# Patient Record
Sex: Male | Born: 1935 | Race: White | Hispanic: No | Marital: Married | Smoking: Former smoker
Health system: Southern US, Community
[De-identification: ages and names within clinical notes are randomized; demographics above are authoritative.]

## PROBLEM LIST (undated history)

## (undated) DIAGNOSIS — M545 Low back pain, unspecified: Secondary | ICD-10-CM

## (undated) DIAGNOSIS — E785 Hyperlipidemia, unspecified: Secondary | ICD-10-CM

## (undated) DIAGNOSIS — M109 Gout, unspecified: Secondary | ICD-10-CM

## (undated) DIAGNOSIS — H905 Unspecified sensorineural hearing loss: Secondary | ICD-10-CM

## (undated) DIAGNOSIS — Z86711 Personal history of pulmonary embolism: Secondary | ICD-10-CM

## (undated) DIAGNOSIS — N200 Calculus of kidney: Secondary | ICD-10-CM

## (undated) DIAGNOSIS — N400 Enlarged prostate without lower urinary tract symptoms: Secondary | ICD-10-CM

## (undated) DIAGNOSIS — Z87898 Personal history of other specified conditions: Secondary | ICD-10-CM

## (undated) DIAGNOSIS — R768 Other specified abnormal immunological findings in serum: Secondary | ICD-10-CM

## (undated) DIAGNOSIS — Z8619 Personal history of other infectious and parasitic diseases: Secondary | ICD-10-CM

## (undated) HISTORY — DX: Personal history of pulmonary embolism: Z86.711

## (undated) HISTORY — DX: Other specified abnormal immunological findings in serum: R76.8

## (undated) HISTORY — DX: Calculus of kidney: N20.0

## (undated) HISTORY — DX: Gout, unspecified: M10.9

## (undated) HISTORY — DX: Low back pain: M54.5

## (undated) HISTORY — DX: Benign prostatic hyperplasia without lower urinary tract symptoms: N40.0

## (undated) HISTORY — DX: Personal history of other specified conditions: Z87.898

## (undated) HISTORY — DX: Unspecified sensorineural hearing loss: H90.5

## (undated) HISTORY — DX: Hyperlipidemia, unspecified: E78.5

## (undated) HISTORY — DX: Low back pain, unspecified: M54.50

## (undated) HISTORY — DX: Personal history of other infectious and parasitic diseases: Z86.19

---

## 1996-03-23 DIAGNOSIS — Z8619 Personal history of other infectious and parasitic diseases: Secondary | ICD-10-CM

## 1996-03-23 HISTORY — DX: Personal history of other infectious and parasitic diseases: Z86.19

## 1998-03-23 HISTORY — PX: OTHER SURGICAL HISTORY: SHX169

## 1998-05-31 ENCOUNTER — Ambulatory Visit (HOSPITAL_BASED_OUTPATIENT_CLINIC_OR_DEPARTMENT_OTHER): Admission: RE | Admit: 1998-05-31 | Discharge: 1998-05-31 | Payer: Self-pay | Admitting: Surgery

## 2005-12-31 ENCOUNTER — Ambulatory Visit (HOSPITAL_COMMUNITY): Admission: RE | Admit: 2005-12-31 | Discharge: 2005-12-31 | Payer: Self-pay | Admitting: Urology

## 2006-05-27 ENCOUNTER — Ambulatory Visit (HOSPITAL_COMMUNITY): Admission: RE | Admit: 2006-05-27 | Discharge: 2006-05-28 | Payer: Self-pay | Admitting: Neurological Surgery

## 2007-10-27 IMAGING — CR DG ABDOMEN 1V
1 series · 1 of 1 positions shown · non-contrast
Comparison: None.

CLINICAL DATA: 79-year-old, with left ureteral stone and preoperative evaluation. 
ABDOMEN ? 1 VIEW:

[t abdomen supine]
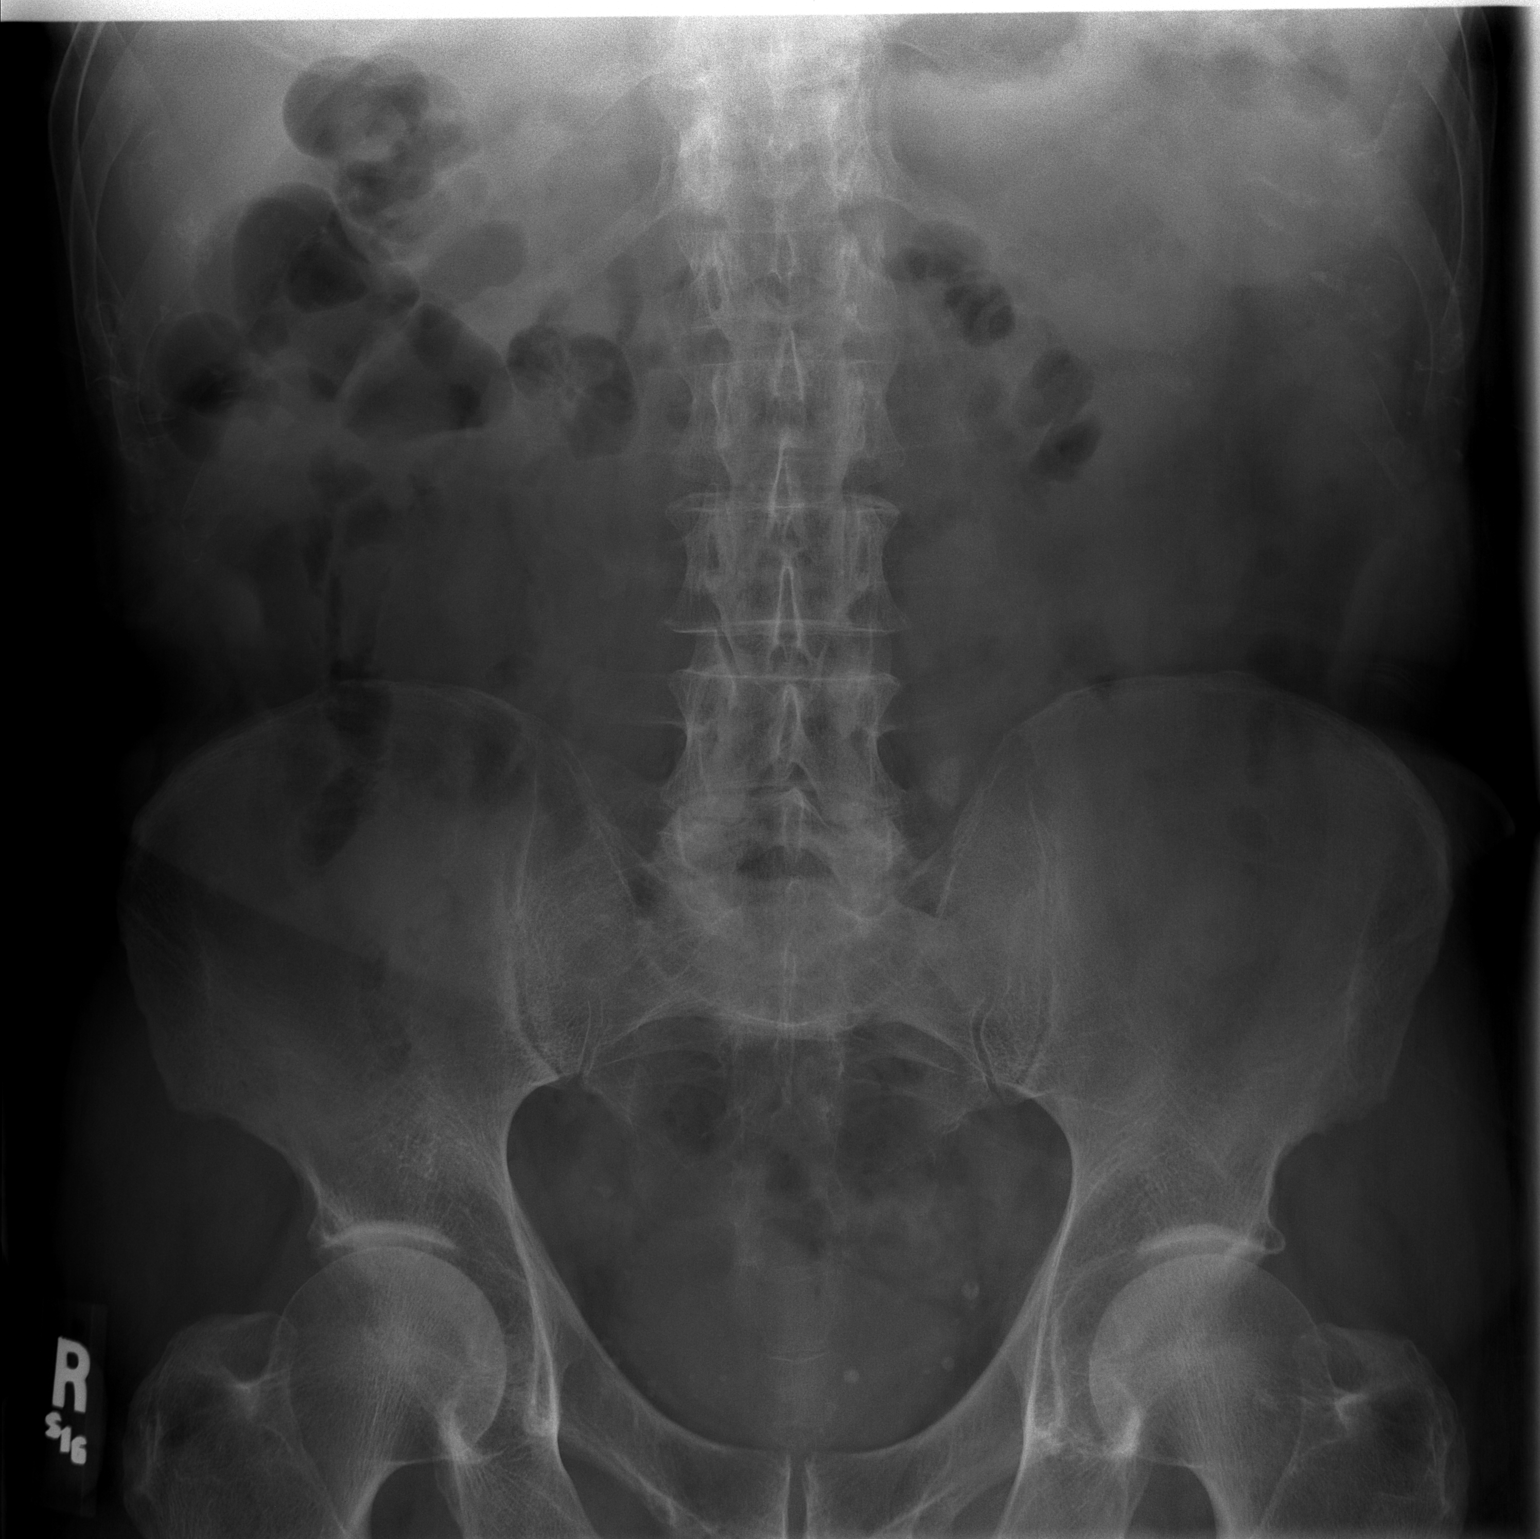

[1 of 1 positions shown; findings below may reference images not displayed]

FINDINGS: Single view of the abdomen demonstrates a nonspecific bowel gas pattern.  There are multiple small phleboliths and calcifications in the pelvic region.  Difficult to differentiate a small phlebolith from a stone in this region.  Prominent density overlying the left L-5 transverse process measuring up to 1.4 cm.  Based on the large size of area this could represent sclerosis within the bone but it could also represent a large ureteral calcification.  Bony structures are intact.
IMPRESSION: 1.  Multiple calcifications overlying the pelvis and an area of sclerosis and possibly a large stone overlying the left L5 transverse process.  Please correlate these findings with any cross sectional imaging if available. 
2.  Nonspecific bowel gas pattern.

## 2008-03-20 IMAGING — CR DG CHEST 2V
2 series · 2 of 2 positions shown · non-contrast
Comparison: None.

CLINICAL DATA: 80 year-old, HNP, radiculopathy.
 CHEST - 2 VIEW:

[view not recorded (1 of 2)]
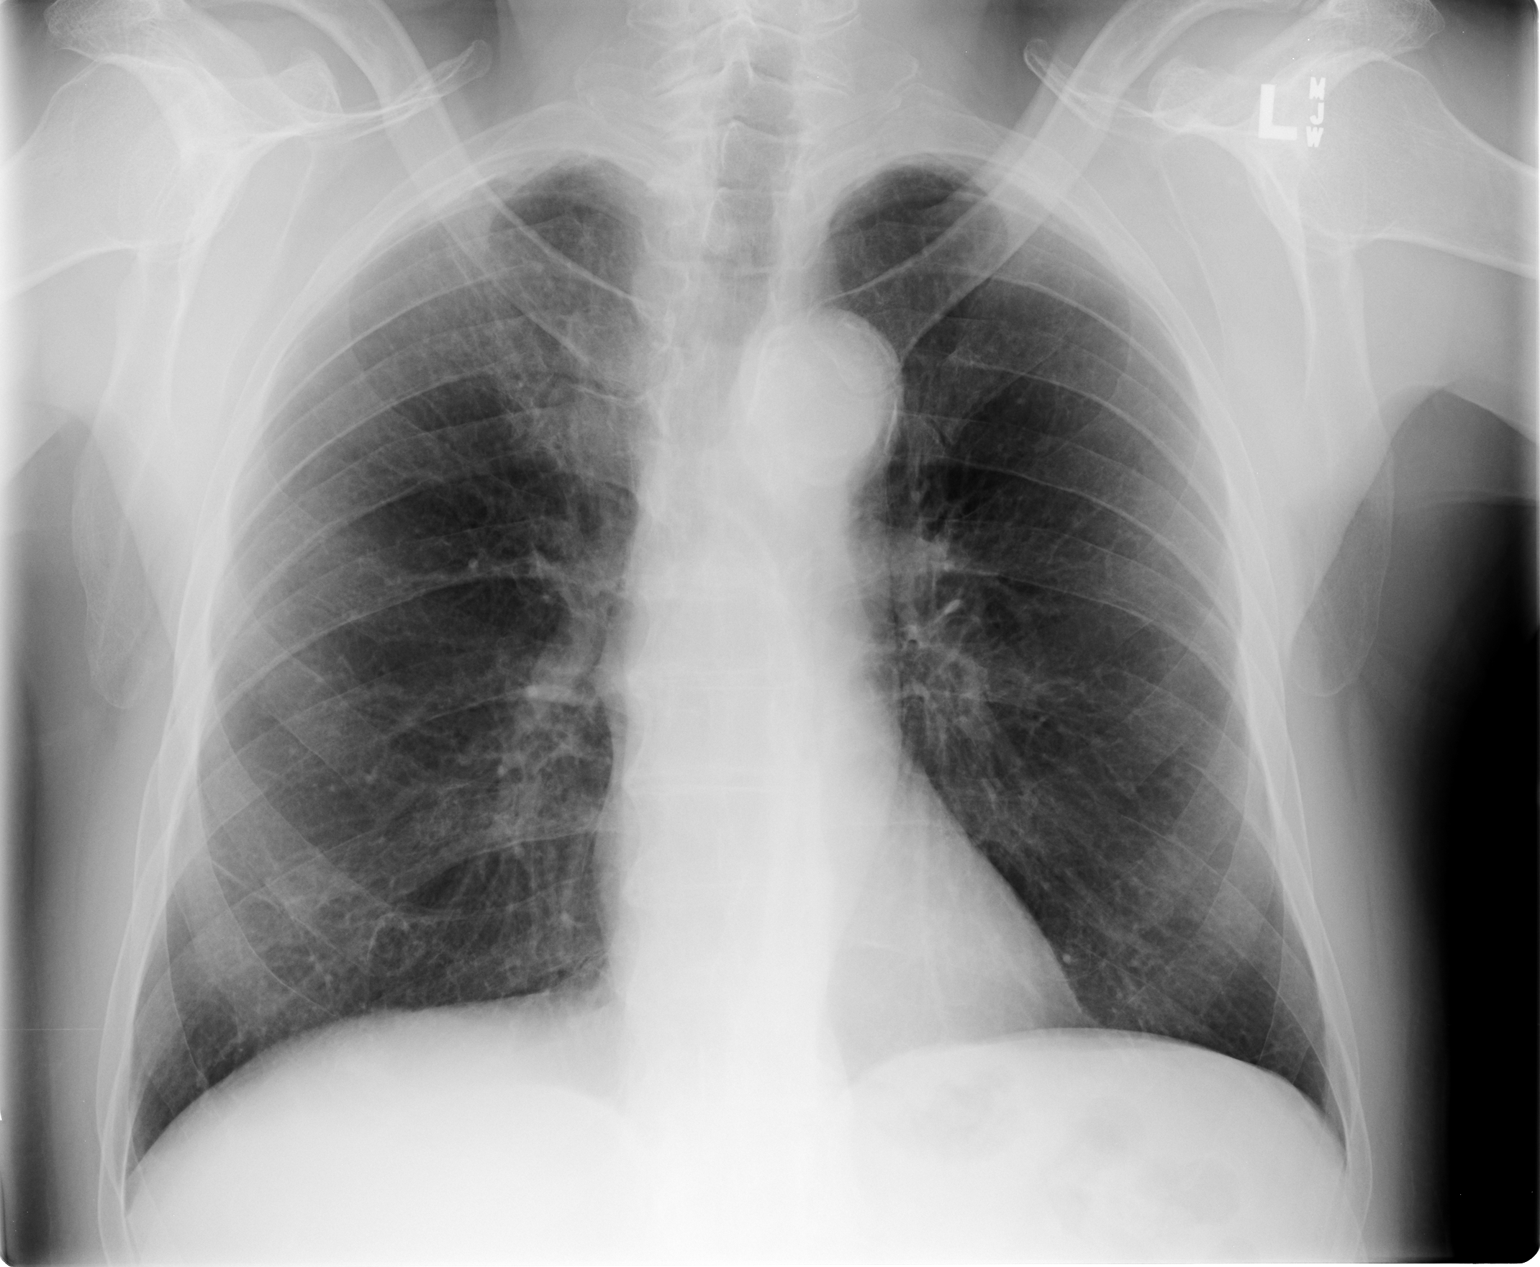

[view not recorded (2 of 2)]
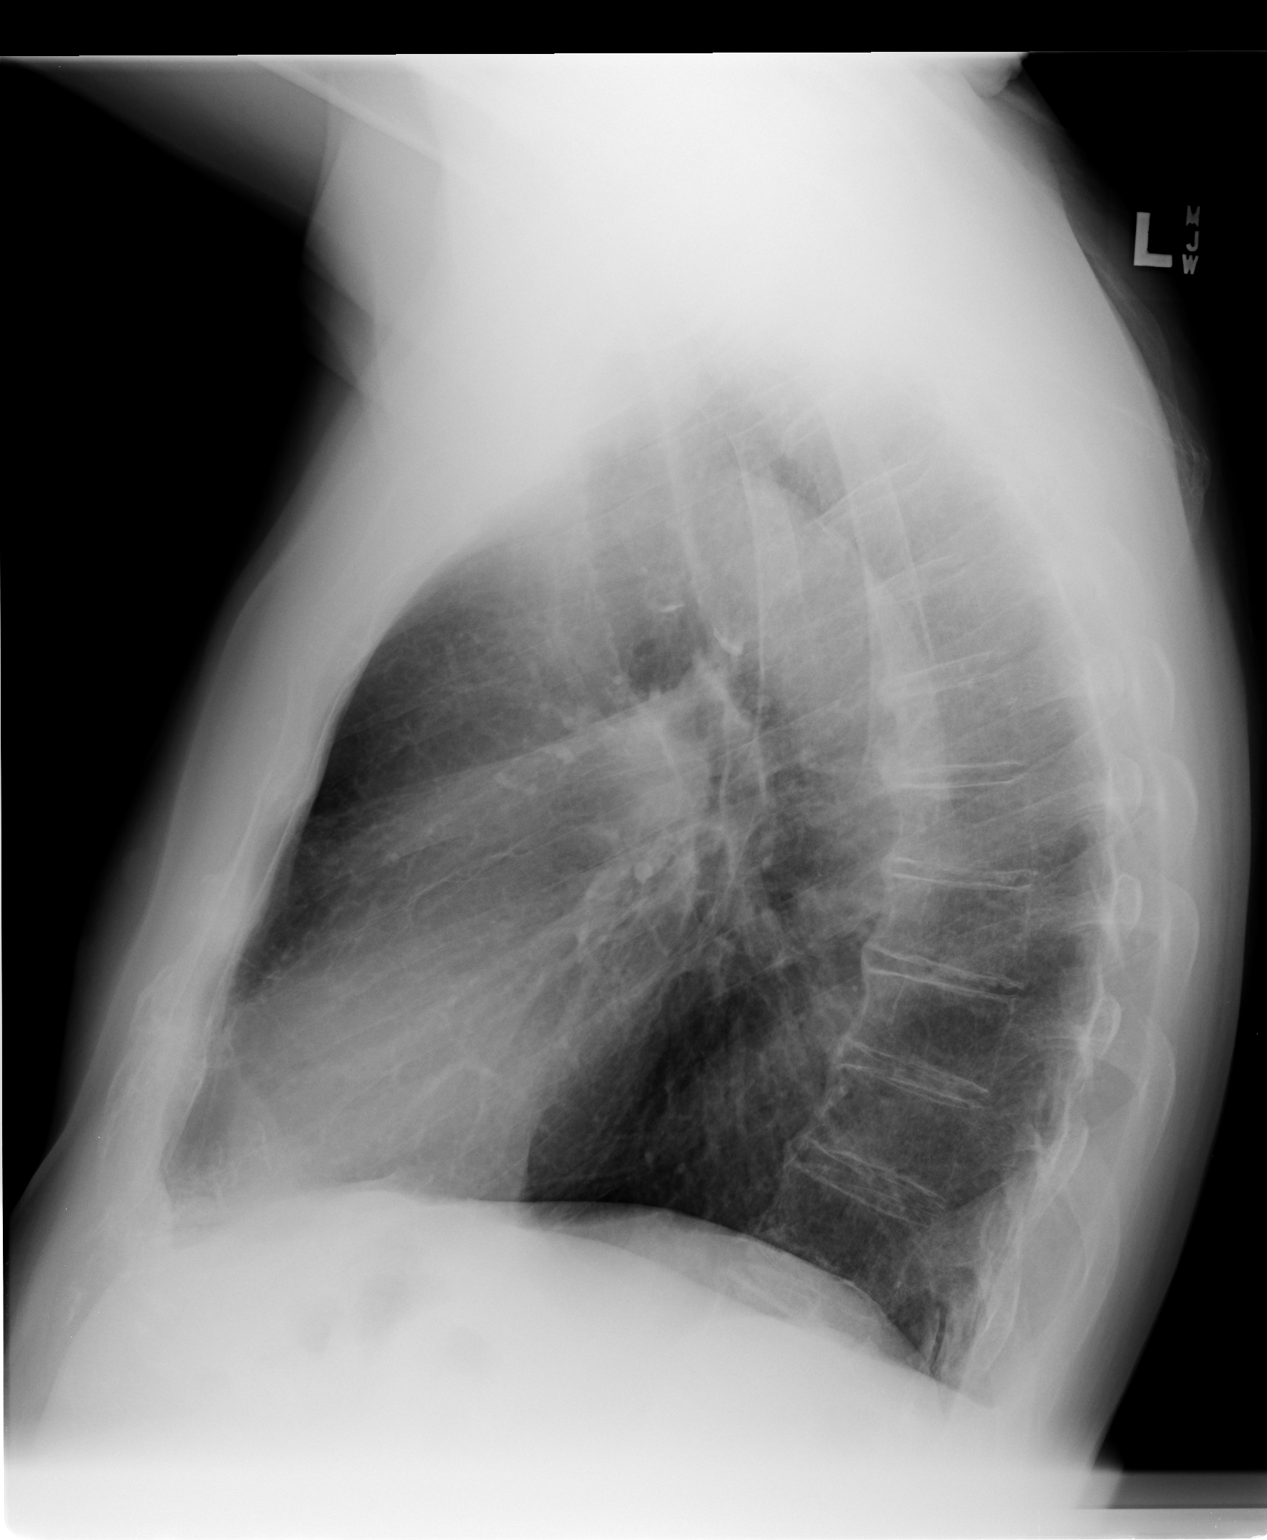

[2 of 2 positions shown; findings below may reference images not displayed]

FINDINGS: Cardiac silhouette, mediastinal contours are within normal limits. There is mild tortuosity, ectasia, and calcification of the thoracic aorta.  There are linear scarring-like changes. No acute pulmonary findings. No pulmonary masses or nodules. Bony structures are intact.
IMPRESSION: No acute cardiopulmonary findings.

## 2008-03-22 IMAGING — CR DG LUMBAR SPINE 1V
1 series · 1 of 1 positions shown · non-contrast
Comparison: none

CLINICAL DATA: 80-year-old, HNP, radiculopathy, microdiskectomy.  
 LUMBAR SPINE - 1 VIEW:

[view not recorded]
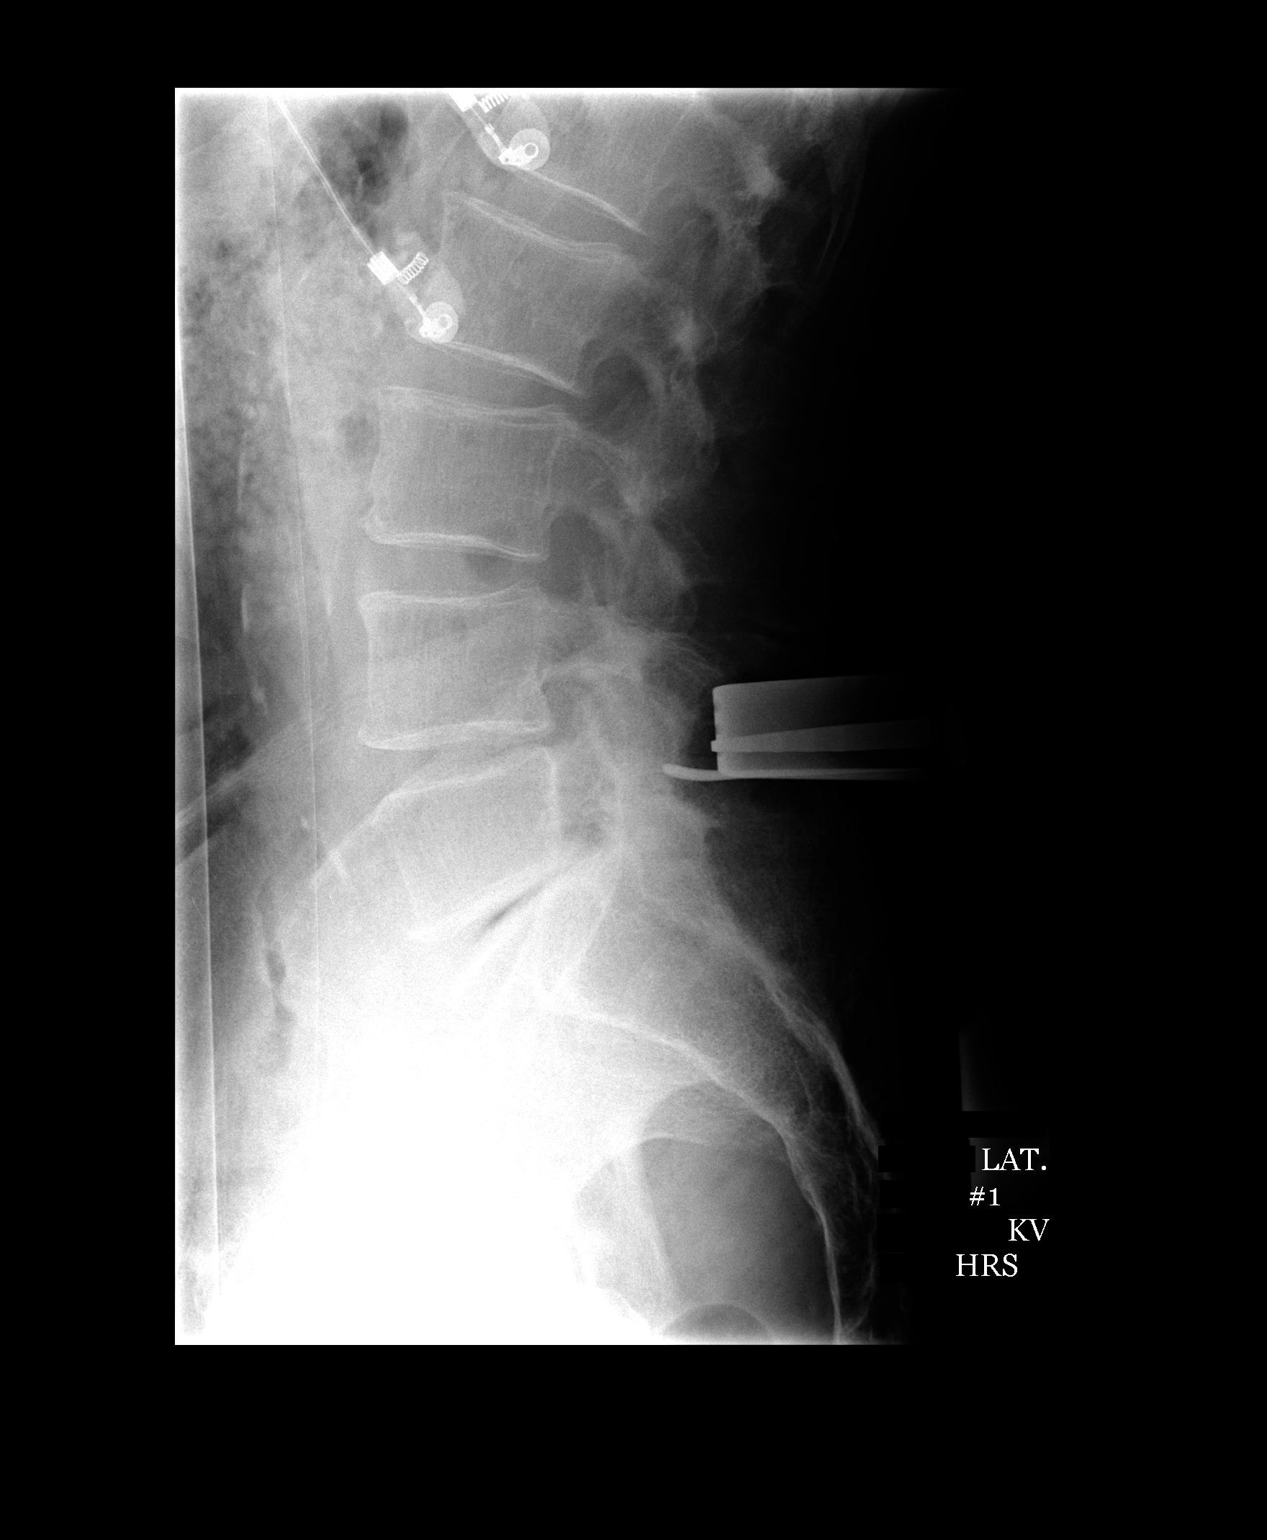

[1 of 1 positions shown; findings below may reference images not displayed]

FINDINGS: There are surgical instruments marking the L4-5 disk space.
IMPRESSION: L4-5 marked intraoperatively.

## 2015-10-09 ENCOUNTER — Ambulatory Visit (INDEPENDENT_AMBULATORY_CARE_PROVIDER_SITE_OTHER): Payer: Medicare Other | Admitting: Cardiology

## 2015-10-09 ENCOUNTER — Encounter: Payer: Self-pay | Admitting: Cardiology

## 2015-10-09 VITALS — BP 112/60 | HR 84 | Ht 70.0 in | Wt 169.0 lb

## 2015-10-09 DIAGNOSIS — R0609 Other forms of dyspnea: Secondary | ICD-10-CM

## 2015-10-09 DIAGNOSIS — R011 Cardiac murmur, unspecified: Secondary | ICD-10-CM

## 2015-10-09 DIAGNOSIS — R9431 Abnormal electrocardiogram [ECG] [EKG]: Secondary | ICD-10-CM | POA: Diagnosis not present

## 2015-10-09 DIAGNOSIS — Z86711 Personal history of pulmonary embolism: Secondary | ICD-10-CM

## 2015-10-09 DIAGNOSIS — E785 Hyperlipidemia, unspecified: Secondary | ICD-10-CM

## 2015-10-09 NOTE — Patient Instructions (Signed)
Your physician recommends that you schedule a follow-up appointment in:  To be determined after test    Your physician has requested that you have an echocardiogram. Echocardiography is a painless test that uses sound waves to create images of your heart. It provides your doctor with information about the size and shape of your heart and how well your heart's chambers and valves are working. This procedure takes approximately one hour. There are no restrictions for this procedure.     Your physician has requested that you have a lexiscan myoview. For further information please visit [email or web address]. Please follow instruction sheet, as given.      Your physician recommends that you continue on your current medications as directed. Please refer to the Current Medication list given to you today.     Thank you for choosing [facility] !

## 2015-10-09 NOTE — Progress Notes (Signed)
Cardiology Office Note  Date: 10/09/2015   ID: Corey Coffey, DOB 02-01-1926, MRN [id number]  PCP: Corey Specking, MD  Consulting Cardiologist: Corey Dell, MD   Chief Complaint  Patient presents with  . Dyspnea on exertion    History of Present Illness: Corey Coffey is an 82 y.o. male referred for cardiology consultation by Dr. Sherril Coffey. I reviewed the available records. He is here today with his wife. Reports a fairly long-standing but progressive history of dyspnea on exertion and fatigue. This has been going on for several months to at least a year. His wife has noticed this more in the last month or so, particularly in the high heat and humidity. He has to rest frequently with trying to do things outdoors. Does not report any exertional chest tightness, but feels very "exhausted."   He reports no known history of CAD, myocardial infarction, or arrhythmia. He does have a previous history of pulmonary embolus in 2006 with recurrent in 2016. He is on Xarelto at this point.  He states he had a recent physical with Dr. Sherril Coffey and had lab work that was unrevealing, we are requesting the results. I did review his recent ECG which is outlined below.  He has not undergone any ischemic testing.  Past Medical History  Diagnosis Date  . Sensorineural hearing loss   . History of pulmonary embolus (PE) 2006, 2016  . History of Helicobacter pylori infection 1998  . Prostatism   . Gout   . Hyperlipidemia   . Low back pain   . Nephrolithiasis   . History of solitary pulmonary nodule   . Hepatitis B core antibody positive     Past Surgical History  Procedure Laterality Date  . Inguinal herniorrhaphy Left 2000    Current Outpatient Prescriptions  Medication Sig Dispense Refill  . rivaroxaban (XARELTO) 20 MG TABS tablet Take 20 mg by mouth daily with supper.    . tamsulosin (FLOMAX) 0.4 MG CAPS capsule Take 0.4 mg by mouth daily.  12  . vitamin B-12 (CYANOCOBALAMIN) 1000 MCG tablet  Take 1,000 mcg by mouth daily.     No current facility-administered medications for this visit.   Allergies:  Review of patient's allergies indicates not on file.   Social History: The patient  reports that he has quit smoking. His smoking use included Cigarettes. He does not have any smokeless tobacco history on file. He reports that he does not drink alcohol or use illicit drugs.   Family History: The patient's family history includes Atrial fibrillation in his brother and sister; Dementia in his mother; Stroke in his father.   ROS:  Please see the history of present illness. Otherwise, complete review of systems is positive for decreased hearing.  All other systems are reviewed and negative.   Physical Exam: VS:  BP 112/60 mmHg  Pulse 84  Ht  (1.778 m)  Wt 169 lb (76.658 kg)  BMI 24.25 kg/m2  SpO2 94%, BMI Body mass index is 24.25 kg/(m^2).  Wt Readings from Last 3 Encounters:  10/09/15 169 lb (76.658 kg)    General: Elderly male, appears comfortable at rest. HEENT: Conjunctiva and lids normal, oropharynx clear. Neck: Supple, no elevated JVP or carotid bruits, no thyromegaly. Lungs: Clear to auscultation, nonlabored breathing at rest. Cardiac: Regular rate and rhythm, no S3, 3/6 apical systolic murmur, no pericardial rub. Abdomen: Soft, nontender, bowel sounds present, no guarding or rebound. Extremities: No pitting edema, distal pulses 2+. Skin: Warm and dry. Musculoskeletal:  Mild kyphosis. Neuropsychiatric: Alert and oriented x3, affect grossly appropriate.  ECG: I personally reviewed the tracing from 10/08/2015 which showed sinus rhythm with prolonged PR interval and right bundle branch block.  Assessment and Plan:  1. Progressive dyspnea on exertion and fatigue as outlined above. Cardiac risk factors include age and gender as well as hyperlipidemia. He has a very remote history of tobacco use but no major chronic lung disease other than pulmonary emboli, no definite  history of pulmonary hypertension. He has a cardiac murmur on examination suggestive of mitral valve disease. ECG shows sinus rhythm with evidence of conduction system disease which is not clearly a symptom provoking issue at this point. He has not undergone any prior ischemic workup. Plan is to proceed with an echocardiogram as well as a Scientist, physiologicalLexiscan Myoview.  2. Heart murmur suggestive of mitral valve disease. Echocardiogram is planned.  3. History of recurrent pulmonary emboli, now on Xarelto since 2016. Requesting most recent lab work from Dr. Sherril CroonVyas.  4. Reported history of hyperlipidemia, managed by diet.  Current medicines were reviewed with the patient today.   Orders Placed This Encounter  Procedures  . NM Myocar Multi W/Spect W/Wall Motion / EF  . ECHOCARDIOGRAM COMPLETE    Disposition: Call with results and determine follow-up.  Signed, Jonelle SidleSamuel G. Jury Caserta, MD, [facility] Hospital[facility]ACC 10/09/2015 1:02 PM    [facility] [address], [place], [place] [address] Phone: [phone]; Fax: [phone]

## 2015-10-15 ENCOUNTER — Ambulatory Visit (HOSPITAL_COMMUNITY)
Admission: RE | Admit: 2015-10-15 | Discharge: 2015-10-15 | Disposition: A | Discharge status: Home / Self Care | Payer: Medicare Other | Source: Ambulatory Visit | Attending: Cardiology | Admitting: Cardiology

## 2015-10-15 ENCOUNTER — Encounter (HOSPITAL_COMMUNITY)
Admission: RE | Admit: 2015-10-15 | Discharge: 2015-10-15 | Disposition: A | Discharge status: Home / Self Care | Payer: Medicare Other | Source: Ambulatory Visit | Attending: Cardiology | Admitting: Cardiology

## 2015-10-15 ENCOUNTER — Encounter (HOSPITAL_COMMUNITY): Payer: Self-pay

## 2015-10-15 ENCOUNTER — Inpatient Hospital Stay (HOSPITAL_COMMUNITY): Admission: RE | Admit: 2015-10-15 | Payer: Medicare Other | Source: Ambulatory Visit

## 2015-10-15 DIAGNOSIS — I358 Other nonrheumatic aortic valve disorders: Secondary | ICD-10-CM | POA: Insufficient documentation

## 2015-10-15 DIAGNOSIS — R0609 Other forms of dyspnea: Secondary | ICD-10-CM | POA: Insufficient documentation

## 2015-10-15 DIAGNOSIS — R011 Cardiac murmur, unspecified: Secondary | ICD-10-CM

## 2015-10-15 DIAGNOSIS — R9431 Abnormal electrocardiogram [ECG] [EKG]: Secondary | ICD-10-CM | POA: Insufficient documentation

## 2015-10-15 LAB — NM MYOCAR MULTI W/SPECT W/WALL MOTION / EF
CHL CUP NUCLEAR SDS: 4
CHL CUP NUCLEAR SRS: 1
CHL CUP NUCLEAR SSS: 5
CHL CUP RESTING HR STRESS: 68 {beats}/min
LV dias vol: 40 mL (ref 62–150)
LV sys vol: 5 mL
NUC STRESS TID: 0.76
Peak HR: 97 {beats}/min
RATE: 0.28

## 2015-10-15 MED ORDER — SODIUM CHLORIDE 0.9% FLUSH
INTRAVENOUS | Status: AC
  Administered 2015-10-15: 10 mL via INTRAVENOUS
  Filled 2015-10-15: qty 10

## 2015-10-15 MED ORDER — TECHNETIUM TC 99M TETROFOSMIN IV KIT
30.0000 | PACK | Freq: Once | INTRAVENOUS | Status: AC | PRN
  Administered 2015-10-15: 30 via INTRAVENOUS

## 2015-10-15 MED ORDER — TECHNETIUM TC 99M TETROFOSMIN IV KIT
10.0000 | PACK | Freq: Once | INTRAVENOUS | Status: AC | PRN
  Administered 2015-10-15: 10 via INTRAVENOUS

## 2015-10-15 MED ORDER — REGADENOSON 0.4 MG/5ML IV SOLN
INTRAVENOUS | Status: AC
  Administered 2015-10-15: 0.4 mg via INTRAVENOUS
  Filled 2015-10-15: qty 5

## 2015-10-15 NOTE — Progress Notes (Signed)
*  PRELIMINARY RESULTS* Echocardiogram 2D Echocardiogram has been performed.  Stacey Drain 10/15/2015, 1:15 PM

## 2019-11-22 DEATH — deceased
